# Patient Record
Sex: Female | Born: 1980 | Race: White | Hispanic: No | Marital: Single | Smoking: Current every day smoker
Health system: Southern US, Community
[De-identification: ages and names within clinical notes are randomized; demographics above are authoritative.]

## PROBLEM LIST (undated history)

## (undated) DIAGNOSIS — R87629 Unspecified abnormal cytological findings in specimens from vagina: Secondary | ICD-10-CM

## (undated) DIAGNOSIS — F319 Bipolar disorder, unspecified: Secondary | ICD-10-CM

## (undated) HISTORY — DX: Unspecified abnormal cytological findings in specimens from vagina: R87.629

---

## 2006-05-30 ENCOUNTER — Ambulatory Visit (HOSPITAL_COMMUNITY): Admission: RE | Admit: 2006-05-30 | Discharge: 2006-05-30 | Payer: Self-pay | Admitting: Family Medicine

## 2010-05-22 ENCOUNTER — Encounter: Admission: RE | Admit: 2010-05-22 | Discharge: 2010-05-22 | Payer: Self-pay | Admitting: Family Medicine

## 2011-11-25 HISTORY — PX: OTHER SURGICAL HISTORY: SHX169

## 2015-12-03 ENCOUNTER — Encounter (HOSPITAL_COMMUNITY): Payer: Self-pay | Admitting: *Deleted

## 2015-12-06 ENCOUNTER — Ambulatory Visit (HOSPITAL_COMMUNITY)
Admission: RE | Admit: 2015-12-06 | Discharge: 2015-12-06 | Disposition: A | Discharge status: Home / Self Care | Payer: Self-pay | Source: Ambulatory Visit | Attending: Obstetrics and Gynecology | Admitting: Obstetrics and Gynecology

## 2015-12-06 ENCOUNTER — Encounter (HOSPITAL_COMMUNITY): Payer: Self-pay

## 2015-12-06 VITALS — BP 122/76 | Temp 98.6°F | Ht 66.0 in | Wt 124.0 lb

## 2015-12-06 DIAGNOSIS — Z1239 Encounter for other screening for malignant neoplasm of breast: Secondary | ICD-10-CM

## 2015-12-06 DIAGNOSIS — R87612 Low grade squamous intraepithelial lesion on cytologic smear of cervix (LGSIL): Secondary | ICD-10-CM

## 2015-12-06 HISTORY — DX: Bipolar disorder, unspecified: F31.9

## 2015-12-06 NOTE — Addendum Note (Signed)
Encounter addended by: Priscille Heidelberghristine P Asiah Befort, RN on: 12/06/2015 11:04 AM<BR>     Documentation filed: Notes Section

## 2015-12-06 NOTE — Progress Notes (Addendum)
[facility] due to recommending a colposcopy to follow up for abnormal Pap smear on 11/22/2015.  Pap Smear:  Pap smear not completed today. [facility] with cells suspicious for HSIL. [facility] for a [facility]. Appointment scheduled for Thursday, December 27, 2015 at 1415. Per patient has a history of abnormal Pap smear 2 years ago that a colposcopy was completed for follow up. Pap smear result is scanned in EPIC under media.  Physical exam: Breasts Breasts symmetrical. No skin abnormalities bilateral breasts. No nipple retraction right breast. Left nipple inverted that per patient is normal for her. No nipple discharge bilateral breasts. No lymphadenopathy. No lumps palpated bilateral breasts. No complaints of pain or tenderness on exam. Screening mammogram recommended at age 35 unless clinically indicated prior.  Pelvic/Bimanual No Pap smear completed today since last Pap smear was 11/22/2015. Pap smear not indicated per BCCCP guidelines.       Smoking History: Smoking cessation discussed with patient. [facility] and gave resources to free smoking cessation classes offered at the [facility].  Patient Navigation: Patient education provided. [facility].

## 2015-12-06 NOTE — Patient Instructions (Addendum)
Educational materials on self breast awareness given. Explained to Brooke Young that colposcopy the needed follow up for her abnormal Pap smear on 11/22/2015. [facility] for a colposcopy. Appointment scheduled for Thursday, December 27, 2015 at 1415. Patient aware of appointment and will be there. Smoking cessation discussed with patient. Referred [facility] to the [facility] and gave resources to free smoking cessation classes offered at the [facility] Let patient know that a screening mammogram is recommended at age 35 unless clinically indicated prior. Brooke Young verbalized understanding.  [place]annock, Kathaleen Maserhristine Poll, RN 10:36 AM

## 2015-12-27 ENCOUNTER — Other Ambulatory Visit (HOSPITAL_COMMUNITY)
Admission: RE | Admit: 2015-12-27 | Discharge: 2015-12-27 | Disposition: A | Discharge status: Home / Self Care | Payer: Self-pay | Source: Ambulatory Visit | Attending: Obstetrics and Gynecology | Admitting: Obstetrics and Gynecology

## 2015-12-27 ENCOUNTER — Encounter: Payer: Self-pay | Admitting: Obstetrics and Gynecology

## 2015-12-27 ENCOUNTER — Ambulatory Visit (INDEPENDENT_AMBULATORY_CARE_PROVIDER_SITE_OTHER): Payer: Self-pay | Admitting: Obstetrics and Gynecology

## 2015-12-27 VITALS — BP 116/78 | HR 94 | Temp 98.2°F | Wt 125.3 lb

## 2015-12-27 DIAGNOSIS — N870 Mild cervical dysplasia: Secondary | ICD-10-CM | POA: Insufficient documentation

## 2015-12-27 DIAGNOSIS — R87612 Low grade squamous intraepithelial lesion on cytologic smear of cervix (LGSIL): Secondary | ICD-10-CM

## 2015-12-27 LAB — POCT PREGNANCY, URINE: PREG TEST UR: NEGATIVE

## 2015-12-27 NOTE — Progress Notes (Addendum)
GYNECOLOGY CLINIC COLPOSCOPY VISIT AND PROCEDURE NOTE  35 y.o. 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 Prior cervical cytology and/or colposcopy findings: yes. Thinks may have had colposcopy in 2014. Unsure those results. The patient reports the following prior treatments to the vulva/vagina/cervix: none. The patient is a cigarette smoker. The patient is not immunosuppressed. The patient is not pregnant. The patient is not taking anticoagulants and denies allergy to iodine.  Patient given informed consent, signed copy in the chart, time out was performed. Urine pregnancy test performed and confirmed to be negative.  Placed in lithotomy position.   Gross findings:   Vagina: normal  Vulva: normal  Cervix: normal  Visualization after:  Acetic acid: sharp acetowhite borders with mosaicism  Green or blue filter: no abnormal vessels  Upper one-third of vagina examined, revealing: normal mucosa  Biopsies obtained: yes, 3 (5, 10, and 12 o'clock)  ECC specimen obtained: yes  Colposcopy adequate? Yes  All specimens were labelled and sent to pathology.  Patient was given post procedure instructions.  Will follow up pathology and manage accordingly.    Patient unsure where previous pap and possible colpo were performed. I have asked her to attempt to obtain those records and either bring them to our office or ask that clinic to fax them to our office.    Shonna Chock, MD OB/GYN Fellow

## 2016-01-07 ENCOUNTER — Telehealth: Payer: Self-pay

## 2016-01-07 NOTE — Telephone Encounter (Signed)
Please review patient procedure on 12/27/2015 and let me know so I can call her with results.

## 2016-02-01 ENCOUNTER — Encounter: Payer: Self-pay | Admitting: Obstetrics & Gynecology

## 2016-02-01 ENCOUNTER — Ambulatory Visit (INDEPENDENT_AMBULATORY_CARE_PROVIDER_SITE_OTHER): Payer: Self-pay | Admitting: Obstetrics & Gynecology

## 2016-02-01 VITALS — BP 125/77 | HR 81 | Temp 98.1°F | Wt 127.1 lb

## 2016-02-01 DIAGNOSIS — N870 Mild cervical dysplasia: Secondary | ICD-10-CM

## 2016-02-01 NOTE — Progress Notes (Signed)
Patient ID: Brooke Young, female   DOB: Dec 18, 1980, 35 y.o.   MRN: [id number] [id number] Patient's last menstrual period was 01/31/2016 (exact date). Here for result of colposcopy and biopsies 01/10/16. She was told she needed LEEP but without high grade lesion I do not recommend LEEP and she should have repeat pap with cotesting in 12 months.   Adam PhenixJames G Arnold, MD 02/01/2016

## 2016-02-01 NOTE — Patient Instructions (Signed)
Cervical Dysplasia  Cervical dysplasia is a condition in which a woman has abnormal changes in the cells of her cervix. The cervix is the opening to the uterus (womb). It is located between the vagina and the uterus. Cervical dysplasia may be the first sign of cervical cancer.   With early detection, treatment, and close follow-up care, nearly all cases of cervical dysplasia can be cured. If left untreated, dysplasia may become more severe.   CAUSES   Cervical dysplasia can be caused by a human papillomavirus (HPV) infection.  RISK FACTORS   · Having had a sexually transmitted disease, such as chlamydia or a human papillomavirus (HPV) infection.    · Becoming sexually active before age 18.    · Having had more than one sexual partner.    · Not using protection during sexual intercourse, especially with new sexual partners.    · Having had cancer of the vagina or vulva.    · Having a sexual partner whose previous partner had cancer of the cervix or cervical dysplasia.    · Having a sexual partner who has or has had cancer of the penis.    · Having a weakened immune system (such as from having HIV or an organ transplant).    · Being the daughter of a woman who took diethylstilbestrol (DES) during pregnancy.    · Having a family history of cervical cancer.    · Smoking.  SIGNS AND SYMPTOMS   There are usually no symptoms. If there are symptoms, they may include:   · Abnormal vaginal discharge.    · Bleeding between periods or after intercourse.    · Bleeding during menopause.    · Pain during sexual intercourse (dyspareunia).  DIAGNOSIS   A test called a Pap test may be done. During this test, cells are taken from the cervix and then looked at under a microscope. A test in which tissue is removed from the cervix (biopsy) may also be done if the Pap test is abnormal or if the cervix looks abnormal.    TREATMENT   Treatment varies based on the severity of the cervical dysplasia. Treatment may include:  · Cryotherapy.  During cryotherapy, the abnormal cells are frozen with a steel-tip instrument.    · A procedure to remove abnormal tissue from the cervix.  · Surgery to remove abnormal tissue. This is usually done in serious cases of cervical dysplasia. Surgical options include:    A cone biopsy. This is a procedure in which the cervical canal and a portion of the center of the cervix are removed.      Hysterectomy. This is a surgery in which the uterus and cervix are removed.  HOME CARE INSTRUCTIONS   · Only take over-the-counter or prescription medicines for pain or discomfort as directed by your health care provider.    · Do not use tampons, have sexual intercourse, or douche until your health care provider says it is okay.  · Keep follow-up appointments as directed by your health care provider. Women who have been treated for cervical dysplasia should have regular pelvic exams and Pap tests. During the first year following treatment of cervical dysplasia, Pap tests should be done every 3-4 months. In the second year, they should be done every 6 months or as recommended by your health care provider.  · To prevent the condition from developing again, practice safe sex.  SEEK MEDICAL CARE IF:   You develop genital warts.    SEEK IMMEDIATE MEDICAL CARE IF:   · Your menstrual period is heavier than normal.    · You develop   bright red bleeding, especially if you have blood clots.    · You have a fever.    · You have increasing cramps or pain not relieved with medicine.    · You are light-headed, unusually weak, or have fainting spells.    · You have abnormal vaginal discharge.    · You have abdominal pain.      This information is not intended to replace advice given to you by your health care provider. Make sure you discuss any questions you have with your health care provider.     Document Released: 11/10/2005 Document Revised: 11/15/2013 Document Reviewed: 07/06/2013  [employer]vier Interactive Patient Education ©2016 [employer]

## 2016-05-12 ENCOUNTER — Emergency Department (HOSPITAL_COMMUNITY)
Admission: EM | Admit: 2016-05-12 | Discharge: 2016-05-12 | Disposition: A | Discharge status: Home / Self Care | Payer: Self-pay | Attending: Emergency Medicine | Admitting: Emergency Medicine

## 2016-05-12 ENCOUNTER — Encounter (HOSPITAL_COMMUNITY): Payer: Self-pay | Admitting: Emergency Medicine

## 2016-05-12 ENCOUNTER — Emergency Department (HOSPITAL_COMMUNITY): Payer: Self-pay

## 2016-05-12 DIAGNOSIS — F17210 Nicotine dependence, cigarettes, uncomplicated: Secondary | ICD-10-CM | POA: Insufficient documentation

## 2016-05-12 DIAGNOSIS — M5431 Sciatica, right side: Secondary | ICD-10-CM

## 2016-05-12 DIAGNOSIS — Z8739 Personal history of other diseases of the musculoskeletal system and connective tissue: Secondary | ICD-10-CM | POA: Insufficient documentation

## 2016-05-12 DIAGNOSIS — W19XXXA Unspecified fall, initial encounter: Secondary | ICD-10-CM | POA: Insufficient documentation

## 2016-05-12 DIAGNOSIS — R202 Paresthesia of skin: Secondary | ICD-10-CM | POA: Insufficient documentation

## 2016-05-12 DIAGNOSIS — M549 Dorsalgia, unspecified: Secondary | ICD-10-CM

## 2016-05-12 DIAGNOSIS — M5441 Lumbago with sciatica, right side: Secondary | ICD-10-CM | POA: Insufficient documentation

## 2016-05-12 DIAGNOSIS — Z79899 Other long term (current) drug therapy: Secondary | ICD-10-CM | POA: Insufficient documentation

## 2016-05-12 DIAGNOSIS — M4180 Other forms of scoliosis, site unspecified: Secondary | ICD-10-CM | POA: Insufficient documentation

## 2016-05-12 MED ORDER — NAPROXEN 500 MG PO TABS: 500.0000 mg | ORAL_TABLET | Freq: Two times a day (BID) | ORAL | Status: AC | PRN

## 2016-05-12 MED ORDER — PREDNISONE 20 MG PO TABS
60.0000 mg | ORAL_TABLET | Freq: Once | ORAL | Status: AC
  Administered 2016-05-12: 60 mg via ORAL
  Filled 2016-05-12: qty 3

## 2016-05-12 MED ORDER — HYDROCODONE-ACETAMINOPHEN 5-325 MG PO TABS
1.0000 | ORAL_TABLET | Freq: Once | ORAL | Status: AC
  Administered 2016-05-12: 1 via ORAL
  Filled 2016-05-12: qty 1

## 2016-05-12 MED ORDER — NAPROXEN 250 MG PO TABS
500.0000 mg | ORAL_TABLET | Freq: Once | ORAL | Status: AC
  Administered 2016-05-12: 500 mg via ORAL
  Filled 2016-05-12: qty 2

## 2016-05-12 MED ORDER — CYCLOBENZAPRINE HCL 10 MG PO TABS: 10.0000 mg | ORAL_TABLET | Freq: Three times a day (TID) | ORAL | Status: AC | PRN

## 2016-05-12 MED ORDER — HYDROCODONE-ACETAMINOPHEN 5-325 MG PO TABS: 1.0000 | ORAL_TABLET | Freq: Four times a day (QID) | ORAL | Status: AC | PRN

## 2016-05-12 MED ORDER — PREDNISONE 20 MG PO TABS: ORAL_TABLET | ORAL | Status: AC

## 2016-05-12 NOTE — ED Notes (Signed)
Pt not in room.

## 2016-05-12 NOTE — Discharge Instructions (Signed)
Back Pain:  Your xrays are reassuring, they do not show any fractures of the bones in your spine. Your mid-back (thoracic spine) did have a slight curvature noted on xray (called "scoliosis"), which is a variant of the curves of your spine, but should not affect anything or cause any issues. Your symptoms are likely from inflammation around the prior bulging disc, as well as muscle strain and likely a bruise/contusion of the bones in your spine related to the fall. Follow the instructions below regarding your symptoms, including instructions on medications to take. Follow up with [facility] in 1-2 weeks for ongoing medical care and reassessment of your back pain. Return to the ER for changes or worsening symptoms as outlined below.  Your back pain should be treated with medicines such as ibuprofen or aleve and this back pain should get better over the next 2 weeks.  However if you develop severe or worsening pain, low back pain with fever, numbness, weakness or inability to walk or urinate, you should return to the ER immediately.  Please follow up with your doctor this week for a recheck if still having symptoms.  Avoid heavy lifting over 10 pounds over the next two weeks.  Low back pain is discomfort in the lower back that may be due to injuries to muscles and ligaments around the spine.  Occasionally, it may be caused by a a problem to a part of the spine called a disc.  The pain may last several days or a week;  However, most patients get completely well in 4 weeks.  Self - care:  The application of heat can help soothe the pain.  Maintaining your daily activities, including walking, is encourged, as it will help you get better faster than just staying in bed. Perform gentle stretching as discussed. Drink plenty of fluids.  Medications are also useful to help with pain control.  A commonly prescribed medication includes norco.  Do not drive or operate heavy machinery while taking this  medication.  Non steroidal anti inflammatory medications including Ibuprofen and naproxen;  These medications help both pain and swelling and are very useful in treating back pain.  They should be taken with food, as they can cause stomach upset, and more seriously, stomach bleeding.  You can take zantac in addition while taking this medication to help prevent stomach discomfort/indigestion. (If the naprosyn prescription is too expensive, you can buy Aleve 247m over the counter medication and just take two tablets twice daily with food).  Prednisone (steroid): this will help with the numbness/tingling in your leg/foot, take every morning with breakfast beginning on 05/13/16 since you got today's dose in the ER.  Muscle relaxants:  These medications can help with muscle tightness that is a cause of lower back pain.  Most of these medications can cause drowsiness, and it is not safe to drive or use dangerous machinery while taking them. Do not combine with alcohol.  SEEK IMMEDIATE MEDICAL ATTENTION IF: New numbness, tingling, weakness, or problem with the use of your arms or legs.  Severe back pain not relieved with medications.  Difficulty with or loss of control of your bowel or bladder control.  Increasing pain in any areas of the body (such as chest or abdominal pain).  Shortness of breath, dizziness or fainting.  Nausea (feeling sick to your stomach), vomiting, fever, or sweats.  You will need to follow up with [facility] in 1-2 weeks for reassessment and to establish  medical care.   Back Pain, Adult Back pain is very common. The pain often gets better over time. The cause of back pain is usually not dangerous. Most people can learn to manage their back pain on their own.  HOME CARE  Watch your back pain for any changes. The following actions may help to lessen any pain you are feeling:  Stay active. Start with short walks on flat ground if you can. Try to walk farther each  day.  Exercise regularly as told by your doctor. Exercise helps your back heal faster. It also helps avoid future injury by keeping your muscles strong and flexible.  Do not sit, drive, or stand in one place for more than 30 minutes.  Do not stay in bed. Resting more than 1-2 days can slow down your recovery.  Be careful when you bend or lift an object. Use good form when lifting:  Bend at your knees.  Keep the object close to your body.  Do not twist.  Sleep on a firm mattress. Lie on your side, and bend your knees. If you lie on your back, put a pillow under your knees.  Take medicines only as told by your doctor.  Put ice on the injured area.  Put ice in a plastic bag.  Place a towel between your skin and the bag.  Leave the ice on for 20 minutes, 2-3 times a day for the first 2-3 days. After that, you can switch between ice and heat packs.  Avoid feeling anxious or stressed. Find good ways to deal with stress, such as exercise.  Maintain a healthy weight. Extra weight puts stress on your back. GET HELP IF:   You have pain that does not go away with rest or medicine.  You have worsening pain that goes down into your legs or buttocks.  You have pain that does not get better in one week.  You have pain at night.  You lose weight.  You have a fever or chills. GET HELP RIGHT AWAY IF:   You cannot control when you poop (bowel movement) or pee (urinate).  Your arms or legs feel weak.  Your arms or legs lose feeling (numbness).  You feel sick to your stomach (nauseous) or throw up (vomit).  You have belly (abdominal) pain.  You feel like you may pass out (faint).   This information is not intended to replace advice given to you by your health care provider. Make sure you discuss any questions you have with your health care provider.   Document Released: 04/28/2008 Document Revised: 12/01/2014 Document Reviewed: 03/14/2014 Elsevier Interactive Patient Education  2016 [employer].  Back Exercises If you have pain in your back, do these exercises 2-3 times each day or as told by your doctor. When the pain goes away, do the exercises once each day, but repeat the steps more times for each exercise (do more repetitions). If you do not have pain in your back, do these exercises once each day or as told by your doctor. EXERCISES Single Knee to Chest Do these steps 3-5 times in a row for each leg:  Lie on your back on a firm bed or the floor with your legs stretched out.  Bring one knee to your chest.  Hold your knee to your chest by grabbing your knee or thigh.  Pull on your knee until you feel a gentle stretch in your lower back.  Keep doing the stretch for 10-30 seconds.  Slowly let go of your leg and straighten it. Pelvic Tilt Do these steps 5-10 times in a row:  Lie on your back on a firm bed or the floor with your legs stretched out.  Bend your knees so they point up to the ceiling. Your feet should be flat on the floor.  Tighten your lower belly (abdomen) muscles to press your lower back against the floor. This will make your tailbone point up to the ceiling instead of pointing down to your feet or the floor.  Stay in this position for 5-10 seconds while you gently tighten your muscles and breathe evenly. Cat-Cow Do these steps until your lower back bends more easily:  Get on your hands and knees on a firm surface. Keep your hands under your shoulders, and keep your knees under your hips. You may put padding under your knees.  Let your head hang down, and make your tailbone point down to the floor so your lower back is round like the back of a cat.  Stay in this position for 5 seconds.  Slowly lift your head and make your tailbone point up to the ceiling so your back hangs low (sags) like the back of a cow.  Stay in this position for 5 seconds. Press-Ups Do these steps 5-10 times in a row:  Lie on your belly (face-down) on the  floor.  Place your hands near your head, about shoulder-width apart.  While you keep your back relaxed and keep your hips on the floor, slowly straighten your arms to raise the top half of your body and lift your shoulders. Do not use your back muscles. To make yourself more comfortable, you may change where you place your hands.  Stay in this position for 5 seconds.  Slowly return to lying flat on the floor. Bridges Do these steps 10 times in a row:  Lie on your back on a firm surface.  Bend your knees so they point up to the ceiling. Your feet should be flat on the floor.  Tighten your butt muscles and lift your butt off of the floor until your waist is almost as high as your knees. If you do not feel the muscles working in your butt and the back of your thighs, slide your feet 1-2 inches farther away from your butt.  Stay in this position for 3-5 seconds.  Slowly lower your butt to the floor, and let your butt muscles relax. If this exercise is too easy, try doing it with your arms crossed over your chest. Belly Crunches Do these steps 5-10 times in a row:  Lie on your back on a firm bed or the floor with your legs stretched out.  Bend your knees so they point up to the ceiling. Your feet should be flat on the floor.  Cross your arms over your chest.  Tip your chin a little bit toward your chest but do not bend your neck.  Tighten your belly muscles and slowly raise your chest just enough to lift your shoulder blades a tiny bit off of the floor.  Slowly lower your chest and your head to the floor. Back Lifts Do these steps 5-10 times in a row: 1. Lie on your belly (face-down) with your arms at your sides, and rest your forehead on the floor. 2. Tighten the muscles in your legs and your butt. 3. Slowly lift your chest off of the floor while you keep your hips on the floor. Keep the  back of your head in line with the curve in your back. Look at the floor while you do  this. 4. Stay in this position for 3-5 seconds. 5. Slowly lower your chest and your face to the floor. GET HELP IF:  Your back pain gets a lot worse when you do an exercise.  Your back pain does not lessen 2 hours after you exercise. If you have any of these problems, stop doing the exercises. Do not do them again unless your doctor says it is okay. GET HELP RIGHT AWAY IF:  You have sudden, very bad back pain. If this happens, stop doing the exercises. Do not do them again unless your doctor says it is okay.   This information is not intended to replace advice given to you by your health care provider. Make sure you discuss any questions you have with your health care provider.   Document Released: 12/13/2010 Document Revised: 08/01/2015 Document Reviewed: 01/04/2015 Elsevier Interactive Patient Education 2016 [place] Injury Prevention Back injuries can be very painful. They can also be difficult to heal. After having one back injury, you are more likely to injure your back again. It is important to learn how to avoid injuring or re-injuring your back. The following tips can help you to prevent a back injury. WHAT SHOULD I KNOW ABOUT PHYSICAL FITNESS?  Exercise for 30 minutes per day on most days of the week or as told by your doctor. Make sure to:  Do aerobic exercises, such as walking, jogging, biking, or swimming.  Do exercises that increase balance and strength, such as tai chi and yoga.  Do stretching exercises. This helps with flexibility.  Try to develop strong belly (abdominal) muscles. Your belly muscles help to support your back.  Stay at a healthy weight. This helps to decrease your risk of a back injury. WHAT SHOULD I KNOW ABOUT MY DIET?  Talk with your doctor about your overall diet. Take supplements and vitamins only as told by your doctor.  Talk with your doctor about how much calcium and vitamin D you need each day. These nutrients help to prevent  weakening of the bones (osteoporosis).  Include good sources of calcium in your diet, such as:  Dairy products.  Green leafy vegetables.  Products that have had calcium added to them (fortified).  Include good sources of vitamin D in your diet, such as:  Milk.  Foods that have had vitamin D added to them. WHAT SHOULD I KNOW ABOUT MY POSTURE?  Sit up straight and stand up straight. Avoid leaning forward when you sit or hunching over when you stand.  Choose chairs that have good low-back (lumbar) support.  If you work at a desk, sit close to it so you do not need to lean over. Keep your chin tucked in. Keep your neck drawn back. Keep your elbows bent so your arms look like the letter "L" (right angle).  Sit high and close to the steering wheel when you drive. Add a low-back support to your car seat, if needed.  Avoid sitting or standing in one position for very long. Take breaks to get up, stretch, and walk around at least one time every hour. Take breaks every hour if you are driving for long periods of time.  Sleep on your side with your knees slightly bent, or sleep on your back with a pillow under your knees. Do not lie on the front of your body to sleep. WHAT SHOULD  I KNOW ABOUT LIFTING, TWISTING, AND REACHING Lifting and Heavy Lifting  Avoid heavy lifting, especially lifting over and over again. If you must do heavy lifting:  Stretch before lifting.  Work slowly.  Rest between lifts.  Use a tool such as a cart or a dolly to move objects if one is available.  Make several small trips instead of carrying one heavy load.  Ask for help when you need it, especially when moving big objects.  Follow these steps when lifting:  Stand with your feet shoulder-width apart.  Get as close to the object as you can. Do not pick up a heavy object that is far from your body.  Use handles or lifting straps if they are available.  Bend at your knees. Squat down, but keep your  heels off the floor.  Keep your shoulders back. Keep your chin tucked in. Keep your back straight.  Lift the object slowly while you tighten the muscles in your legs, belly, and butt. Keep the object as close to the center of your body as possible.  Follow these steps when putting down a heavy load:  Stand with your feet shoulder-width apart.  Lower the object slowly while you tighten the muscles in your legs, belly, and butt. Keep the object as close to the center of your body as possible.  Keep your shoulders back. Keep your chin tucked in. Keep your back straight.  Bend at your knees. Squat down, but keep your heels off the floor.  Use handles or lifting straps if they are available. Twisting and Reaching  Avoid lifting heavy objects above your waist.  Do not twist at your waist while you are lifting or carrying a load. If you need to turn, move your feet.  Do not bend over without bending at your knees.  Avoid reaching over your head, across a table, or for an object on a high surface.  WHAT ARE SOME OTHER TIPS?  Avoid wet floors and icy ground. Keep sidewalks clear of ice to prevent falls.   Do not sleep on a mattress that is too soft or too hard.   Keep items that you use often within easy reach.   Put heavier objects on shelves at waist level, and put lighter objects on lower or higher shelves.  Find ways to lower your stress, such as:  Exercise.  Massage.  Relaxation techniques.  Talk with your doctor if you feel anxious or depressed. These conditions can make back pain worse.  Wear flat heel shoes with cushioned soles.  Avoid making quick (sudden) movements.  Use both shoulder straps when carrying a backpack.  Do not use any tobacco products, including cigarettes, chewing tobacco, or electronic cigarettes. If you need help quitting, ask your doctor.   This information is not intended to replace advice given to you by your health care provider. Make  sure you discuss any questions you have with your health care provider.   Document Released: 04/28/2008 Document Revised: 03/27/2015 Document Reviewed: 11/14/2014 Elsevier Interactive Patient Education 2016 [employer]   Radicular Pain Radicular pain in either the arm or leg is usually from a bulging or herniated disk in the spine. A piece of the herniated disk may press against the nerves as the nerves exit the spine. This causes pain which is felt at the tips of the nerves down the arm or leg. Other causes of radicular pain may include:  Fractures.  Heart disease.  Cancer.  An abnormal and usually  degenerative state of the nervous system or nerves (neuropathy). Diagnosis may require CT or MRI scanning to determine the primary cause.  Nerves that start at the neck (nerve roots) may cause radicular pain in the outer shoulder and arm. It can spread down to the thumb and fingers. The symptoms vary depending on which nerve root has been affected. In most cases radicular pain improves with conservative treatment. Neck problems may require physical therapy, a neck collar, or cervical traction. Treatment may take many weeks, and surgery may be considered if the symptoms do not improve.  Conservative treatment is also recommended for sciatica. Sciatica causes pain to radiate from the lower back or buttock area down the leg into the foot. Often there is a history of back problems. Most patients with sciatica are better after 2 to 4 weeks of rest and other supportive care. Short term bed rest can reduce the disk pressure considerably. Sitting, however, is not a good position since this increases the pressure on the disk. You should avoid bending, lifting, and all other activities which make the problem worse. Traction can be used in severe cases. Surgery is usually reserved for patients who do not improve within the first months of treatment. Only take over-the-counter or prescription medicines for  pain, discomfort, or fever as directed by your caregiver. Narcotics and muscle relaxants may help by relieving more severe pain and spasm and by providing mild sedation. Cold or massage can give significant relief. Spinal manipulation is not recommended. It can increase the degree of disc protrusion. Epidural steroid injections are often effective treatment for radicular pain. These injections deliver medicine to the spinal nerve in the space between the protective covering of the spinal cord and back bones (vertebrae). Your caregiver can give you more information about steroid injections. These injections are most effective when given within two weeks of the onset of pain.  You should see your caregiver for follow up care as recommended. A program for neck and back injury rehabilitation with stretching and strengthening exercises is an important part of management.  SEEK IMMEDIATE MEDICAL CARE IF:  You develop increased pain, weakness, or numbness in your arm or leg.  You develop difficulty with bladder or bowel control.  You develop abdominal pain.   This information is not intended to replace advice given to you by your health care provider. Make sure you discuss any questions you have with your health care provider.   Document Released: 12/18/2004 Document Revised: 12/01/2014 Document Reviewed: 06/06/2015 Elsevier Interactive Patient Education 2016 [employer] Inc.  Sciatica Sciatica is pain, weakness, numbness, or tingling along your sciatic nerve. The nerve starts in the lower back and runs down the back of each leg. Nerve damage or certain conditions pinch or put pressure on the sciatic nerve. This causes the pain, weakness, and other discomforts of sciatica. HOME CARE   Only take medicine as told by your doctor.  Apply ice to the affected area for 20 minutes. Do this 3-4 times a day for the first 48-72 hours. Then try heat in the same way.  Exercise, stretch, or do your usual activities  if these do not make your pain worse.  Go to physical therapy as told by your doctor.  Keep all doctor visits as told.  Do not wear high heels or shoes that are not supportive.  Get a firm mattress if your mattress is too soft to lessen pain and discomfort. GET HELP RIGHT AWAY IF:   You cannot control when  you poop (bowel movement) or pee (urinate).  You have more weakness in your lower back, lower belly (pelvis), butt (buttocks), or legs.  You have redness or puffiness (swelling) of your back.  You have a burning feeling when you pee.  You have pain that gets worse when you lie down.  You have pain that wakes you from your sleep.  Your pain is worse than past pain.  Your pain lasts longer than 4 weeks.  You are suddenly losing weight without reason. MAKE SURE YOU:   Understand these instructions.  Will watch this condition.  Will get help right away if you are not doing well or get worse.   This information is not intended to replace advice given to you by your health care provider. Make sure you discuss any questions you have with your health care provider.   Document Released: 08/19/2008 Document Revised: 08/01/2015 Document Reviewed: 03/21/2012 Elsevier Interactive Patient Education 2016 [employer] Inc[employer]  Scoliosis Scoliosis is the name given to a spine that curves sideways.Scoliosis can cause twisting of your shoulders, hips, chest, back, and rib cage.  CAUSES  The cause of scoliosis is not always known. It may be caused by a birth defect or by a disease that can cause muscular dysfunction and imbalance, such as cerebral palsy and muscular dystrophy.  RISK FACTORS Having a disease that causes muscle disease or dysfunction. SIGNS AND SYMPTOMS Scoliosis often has no signs or symptoms.If they are present, they may include:  Unequal size of one body side compared to the other (asymmetry).  Visible curvature of the spine.  Pain. The pain may limit physical  activity.  Shortness of breath.  Bowel or bladder issues. DIAGNOSIS A skilled health care provider will perform an evaluation. This will involve:  Taking your history.  Performing a physical examination.  Performing a neurological exam to detect nerve or muscle function loss.  Range of motion studies on the spine.  X-rays. An MRI may also be obtained. TREATMENT  Treatment varies depending on the nature, extent, and severity of the disease. If the curvature is not great, you may need only observation. A brace may be used to prevent scoliosis from progressing. A brace may also be needed during growth spurts. Physical therapy may be of benefit. Surgery may be required.  HOME CARE INSTRUCTIONS   Your health care provider may suggest exercises to strengthen your muscles. Perform them as directed.  Ask your health care provider before participating in any sports.   If you have been prescribed an orthopedic brace, wear it as instructed by your health care provider. SEEK MEDICAL CARE IF: Your brace causes the skin to become sore (chafe) or is uncomfortable.  SEEK IMMEDIATE MEDICAL CARE IF:  You have back pain that is not relieved by the medicines prescribed by your health care provider.   Your legs feel weak or you lose function in your legs.  You lose some bowel or bladder control.    This information is not intended to replace advice given to you by your health care provider. Make sure you discuss any questions you have with your health care provider.   Document Released: 11/07/2000 Document Revised: 11/15/2013 Document Reviewed: 07/18/2013 Elsevier Interactive Patient Education [employer].

## 2016-05-12 NOTE — ED Provider Notes (Signed)
CSN: [id number]     Arrival date & time 05/12/16  4098 History   First MD Initiated Contact with Patient 05/12/16 [phone]     Chief Complaint  Patient presents with  . Fall  . Back Pain  . Numbness     (Consider location/radiation/quality/duration/timing/severity/associated sxs/prior Treatment) HPI Comments: Brooke Young is a 35 y.o. female with a PMHx of L5/S1 annular bulging disc and bipolar 1 disorder, who presents to the ED with complaints of mid and low back pain 4 days after a fall while playing roller [place]. Patient states that her legs were kicked out from under her during practice on Thursday, causing her to fall backwards onto her back. Denies head injury or LOC. Since then she has had gradually worsening mid and lower back pain which she describes as 5/10 intermittent sharp pain in the mid and lower back, radiating down the posterior right leg, worse with sitting, standing, and movements or activities, and improved somewhat with Tylenol, heat, and ice. Associated symptoms include intermittent numbness and tingling in the right foot she describes as a "fallen asleep feeling". She had these similar symptoms when she was told she had a bulging disc 40yrs ago.   She denies any fevers, chills, chest pain, shortness breath, abdominal pain, nausea vomiting, diarrhea, constipation, dysuria, hematuria, incontinence of urine or stool, cauda equina symptoms or saddle anesthesia, bruising, or focal weakness. No PCP due to not having insurance at this time.   Patient is a 35 y.o. female presenting with fall and back pain. The history is provided by the patient and medical records. No language interpreter was used.  Fall This is a new problem. The current episode started in the past 7 days. The problem occurs rarely. The problem has been unchanged. Pertinent negatives include no abdominal pain, arthralgias, chest pain, chills, fever, myalgias, nausea, neck pain, numbness, urinary symptoms, vomiting or  weakness. The symptoms are aggravated by standing and walking. She has tried acetaminophen, heat and ice for the symptoms. The treatment provided moderate relief.  Back Pain Associated symptoms: no abdominal pain, no chest pain, no dysuria, no fever, no numbness and no weakness     Past Medical History  Diagnosis Date  . Bipolar 1 disorder (HCC)   . Vaginal Pap smear, abnormal    Past Surgical History  Procedure Laterality Date  . Laparoscopic surgery  2013   Family History  Problem Relation Age of Onset  . Hypertension Mother   . Stroke Maternal Grandmother    Social History  Substance Use Topics  . Smoking status: Current Every Day Smoker -- 1.00 packs/day    Types: Cigarettes  . Smokeless tobacco: Never Used  . Alcohol Use: Yes     Comment: occassionally   OB History    Gravida Para Term Preterm AB TAB SAB Ectopic Multiple Living   1    1  1         Review of Systems  Constitutional: Negative for fever and chills.  HENT: Negative for facial swelling (no head inj).   Respiratory: Negative for shortness of breath.   Cardiovascular: Negative for chest pain.  Gastrointestinal: Negative for nausea, vomiting, abdominal pain, diarrhea and constipation.  Genitourinary: Negative for dysuria, hematuria and difficulty urinating (no incontinence).  Musculoskeletal: Positive for back pain. Negative for myalgias, arthralgias and neck pain.  Skin: Negative for color change.  Allergic/Immunologic: Negative for immunocompromised state.  Neurological: Negative for syncope, weakness and numbness.       +tingling/numbness R  foot  Psychiatric/Behavioral: Negative for confusion.   10 Systems reviewed and are negative for acute change except as noted in the HPI.    Allergies  Review of patient's allergies indicates no known allergies.  Home Medications   Prior to Admission medications   Medication Sig Start Date End Date Taking? Authorizing Provider  B Complex-Folic Acid (B  COMPLEX-VITAMIN B12 PO) Take 1 capsule by mouth daily.    Historical Provider, MD  bismuth subsalicylate (PEPTO BISMOL) 262 MG/15ML suspension Take 30 mLs by mouth every 6 (six) hours as needed.    Historical Provider, MD  diphenhydrAMINE (BENADRYL) 25 mg capsule Take 25 mg by mouth every 6 (six) hours as needed.    Historical Provider, MD  ibuprofen (ADVIL,MOTRIN) 600 MG tablet Take 600 mg by mouth every 6 (six) hours as needed.    Historical Provider, MD  Multiple Vitamin (MULTIVITAMIN) tablet Take 1 tablet by mouth daily.    Historical Provider, MD  norgestimate-ethinyl estradiol (SPRINTEC 28) 0.25-35 MG-MCG tablet Take 1 tablet by mouth daily.    Historical Provider, MD   BP 140/92 mmHg  Pulse 84  Temp(Src) 98 F (36.7 C) (Oral)  Resp 18  Ht 5\' 6"  (1.676 m)  Wt 54.432 kg  BMI 19.38 kg/m2  SpO2 100%  LMP 04/21/2016 Physical Exam  Constitutional: She is oriented to person, place, and time. Vital signs are normal. She appears well-developed and well-nourished.  Non-toxic appearance. No distress.  Afebrile, nontoxic, NAD  HENT:  Head: Normocephalic and atraumatic.  Mouth/Throat: Oropharynx is clear and moist and mucous membranes are normal.  Eyes: Conjunctivae and EOM are normal. Right eye exhibits no discharge. Left eye exhibits no discharge.  Neck: Normal range of motion. Neck supple. No spinous process tenderness and no muscular tenderness present. No rigidity. Normal range of motion present.  FROM intact without spinous process TTP, no bony stepoffs or deformities, no paraspinous muscle TTP or muscle spasms. No rigidity or meningeal signs. No bruising or swelling.   Cardiovascular: Normal rate, regular rhythm, normal heart sounds and intact distal pulses.  Exam reveals no gallop and no friction rub.   No murmur heard. Pulmonary/Chest: Effort normal and breath sounds normal. No respiratory distress. She has no decreased breath sounds. She has no wheezes. She has no rhonchi. She has no  rales.  Abdominal: Soft. Normal appearance and bowel sounds are normal. She exhibits no distension. There is no tenderness. There is no rigidity, no rebound, no guarding, no CVA tenderness, no tenderness at McBurney's point and negative Murphy's sign.  Musculoskeletal: Normal range of motion.       Thoracic back: She exhibits tenderness and bony tenderness. She exhibits normal range of motion, no swelling, no deformity and no spasm.       Lumbar back: She exhibits tenderness and bony tenderness. She exhibits normal range of motion, no swelling, no deformity and no spasm.       Back:  Thoracic and Lumbar spine with FROM intact with mild midline spinous process TTP near T10 and L3 regions, no bony stepoffs or deformities, no paraspinous muscle TTP or muscle spasms. Strength 5/5 in all extremities, sensation grossly intact in all extremities although she reports slightly diminished sensation to the R lateral lower leg and lateral foot, +R sided SLR, neg SLR on L side, gait steady. No overlying skin changes, no bruising or swelling noted to the spine. Distal pulses intact.   Neurological: She is alert and oriented to person, place, and time. She has  normal strength. No sensory deficit.  Skin: Skin is warm, dry and intact. No rash noted.  Psychiatric: She has a normal mood and affect.  Nursing note and vitals reviewed.   ED Course  Procedures (including critical care time) Labs Review Labs Reviewed - No data to display  Imaging Review Dg Thoracic Spine W/swimmers  05/12/2016  CLINICAL DATA:  Fall roller-skating. Back pain. Numbness in low foot. EXAM: THORACIC SPINE - 3 VIEWS COMPARISON:  None. FINDINGS: Slight levoscoliosis in the upper thoracic spine. No fracture or subluxation. IMPRESSION: No acute bony abnormality. Electronically Signed   By: Charlett Nose M.D.   On: 05/12/2016 09:37   Dg Lumbar Spine Complete  05/12/2016  CLINICAL DATA:  Fall roller-skating. Back pain. Numbness and tingling in  foot. EXAM: LUMBAR SPINE - COMPLETE 4+ VIEW COMPARISON:  MRI 05/22/2010 FINDINGS: Transitional anatomy at the lumbosacral junction. Normal alignment. No fracture or subluxation. SI joints are symmetric and unremarkable. IMPRESSION: No acute bony abnormality. Electronically Signed   By: Charlett Nose M.D.   On: 05/12/2016 09:37     05/22/10 MRI L-spine without contrast Study Result     Clinical Data: Low back pain. Lifting injury. Right leg pain.  MRI LUMBAR SPINE WITHOUT CONTRAST  Technique: Multiplanar and multiecho pulse sequences of the lumbar spine were obtained without intravenous contrast.  Comparison: None  Findings: Anatomy is transitional. I am assuming that S1 is a transitional vertebra. Correlation with this numbering scheme should be performed if any intervention is contemplated.  There is no abnormality at L4-5 or above. The discs are normal in signal characteristics and morphology. The spinal canal and foramina are widely patent. The distal cord and conus are normal.  At L5-S1, the disc shows degeneration and annular tearing. There is annular bulging. There is no neural compression.  S1-2 is transitional and unremarkable.  IMPRESSION: S1 is transitional.  Single level pathology at L5-S1 with desiccation and annular bulging. No apparent neural compression however. The findings could certainly relate to back pain.  Provider: Isaiah Blakes    I have personally reviewed and evaluated these images and lab results as part of my medical decision-making.   EKG Interpretation None      MDM   Final diagnoses:  Back pain  Fall, initial encounter  Right leg paresthesias  Sciatica, right side  History of herniated intervertebral disc  Levoscoliosis    35 y.o. female here with back pain s/p fall during roller derby 4 days ago, fell straight down onto her back. Some paresthesias to her R foot intermittently since then. Mildly TTP along  midline thoracic and lumbar areas, no bruising noted, but tenderness is directly overtop of the spinous processes, will obtain xray to ensure no acute fx. +SLR of R side. NVI in all extremities. MRI previously in 2011 showed L5/S1 bulging disc which could explain her sciatica type symptoms today, will start on short course of prednisone. No red flag s/s of low back pain. No s/s of central cord compression or cauda equina. Lower extremities are neurovascularly intact and patient is ambulating without difficulty. Doubt need for an emergent MRI today. Will obtain xray and reassess after this, will give norco/naprosyn/prednisone now and reassess shortly  10:25 AM Xrays neg for acute findings, showed mild scoliosis of T-spine. Pt feeling better after meds. Patient was counseled on back pain precautions and told to do activity as tolerated but do not lift, push, or pull heavy objects more than 10 pounds for the next week. Patient  counseled to use ice or heat on back for no longer than 15 minutes every hour.   Rx given for muscle relaxer and counseled on proper use of muscle relaxant medication. Rx given for narcotic pain medicine and counseled on proper use of narcotic pain medications. Told that they can increase to every 4 hrs if needed while pain is worse. Counseled not to combine this medication with others containing tylenol. Urged patient not to drink alcohol, drive, or perform any other activities that requires focus while taking either of these medications.   Patient urged to follow-up with [facility] in 1-2wks to establish care, and for f/up if pain does not improve with treatment and rest or if pain becomes recurrent. Urged to return with worsening severe pain, loss of bowel or bladder control, trouble walking. The patient verbalizes understanding and agrees with the plan.   BP 140/92 mmHg  Pulse 84  Temp(Src) 98 F (36.7 C) (Oral)  Resp 18  Ht 5\' 6"  (1.676 m)  Wt 54.432 kg  BMI 19.38 kg/m2  SpO2 100%   LMP 04/21/2016  Meds ordered this encounter  Medications  . predniSONE (DELTASONE) tablet 60 mg    Sig:   . HYDROcodone-acetaminophen (NORCO/VICODIN) 5-325 MG per tablet 1 tablet    Sig:   . naproxen (NAPROSYN) tablet 500 mg    Sig:   . naproxen (NAPROSYN) 500 MG tablet    Sig: Take 1 tablet (500 mg total) by mouth 2 (two) times daily as needed for mild pain or moderate pain (TAKE WITH MEALS.).    Dispense:  20 tablet    Refill:  0    Order Specific Question:  Supervising Provider    Answer:  MILLER, BRIAN [[id number]]  . HYDROcodone-acetaminophen (NORCO) 5-325 MG tablet    Sig: Take 1 tablet by mouth every 6 (six) hours as needed for severe pain.    Dispense:  10 tablet    Refill:  0    Order Specific Question:  Supervising Provider    Answer:  Hyacinth Meeker, BRIAN [[id number]]  . cyclobenzaprine (FLEXERIL) 10 MG tablet    Sig: Take 1 tablet (10 mg total) by mouth 3 (three) times daily as needed for muscle spasms.    Dispense:  15 tablet    Refill:  0    Order Specific Question:  Supervising Provider    Answer:  MILLER, BRIAN [[id number]]  . predniSONE (DELTASONE) 20 MG tablet    Sig: 3 tabs po daily x 3 days starting on 05/13/16    Dispense:  9 tablet    Refill:  0    Order Specific Question:  Supervising Provider    Answer:  Eber Hong [[id number]]     Brooke Folds Camprubi-Soms, PA-C 05/12/16 1026  Jacalyn Lefevre, MD 05/12/16 [phone]

## 2016-05-12 NOTE — ED Notes (Signed)
Pt. Stated, I was roller derby and I fell right on my back and like bumped twice. I felt like I needed to come since I have a numbness in my foot and tingling, I've had a bulging disc 6 years ago.

## 2016-06-11 ENCOUNTER — Encounter (HOSPITAL_COMMUNITY): Payer: Self-pay | Admitting: *Deleted

## 2016-07-04 ENCOUNTER — Telehealth (HOSPITAL_BASED_OUTPATIENT_CLINIC_OR_DEPARTMENT_OTHER): Payer: Self-pay | Admitting: Emergency Medicine

## 2017-05-21 IMAGING — CR DG LUMBAR SPINE COMPLETE 4+V
5 series · 5 of 5 positions shown · non-contrast
Comparison: MRI 05/22/2010

CLINICAL DATA: Fall roller-skating. Back pain. Numbness and
tingling in foot.

EXAM:
LUMBAR SPINE - COMPLETE 4+ VIEW

[l-spine ap]
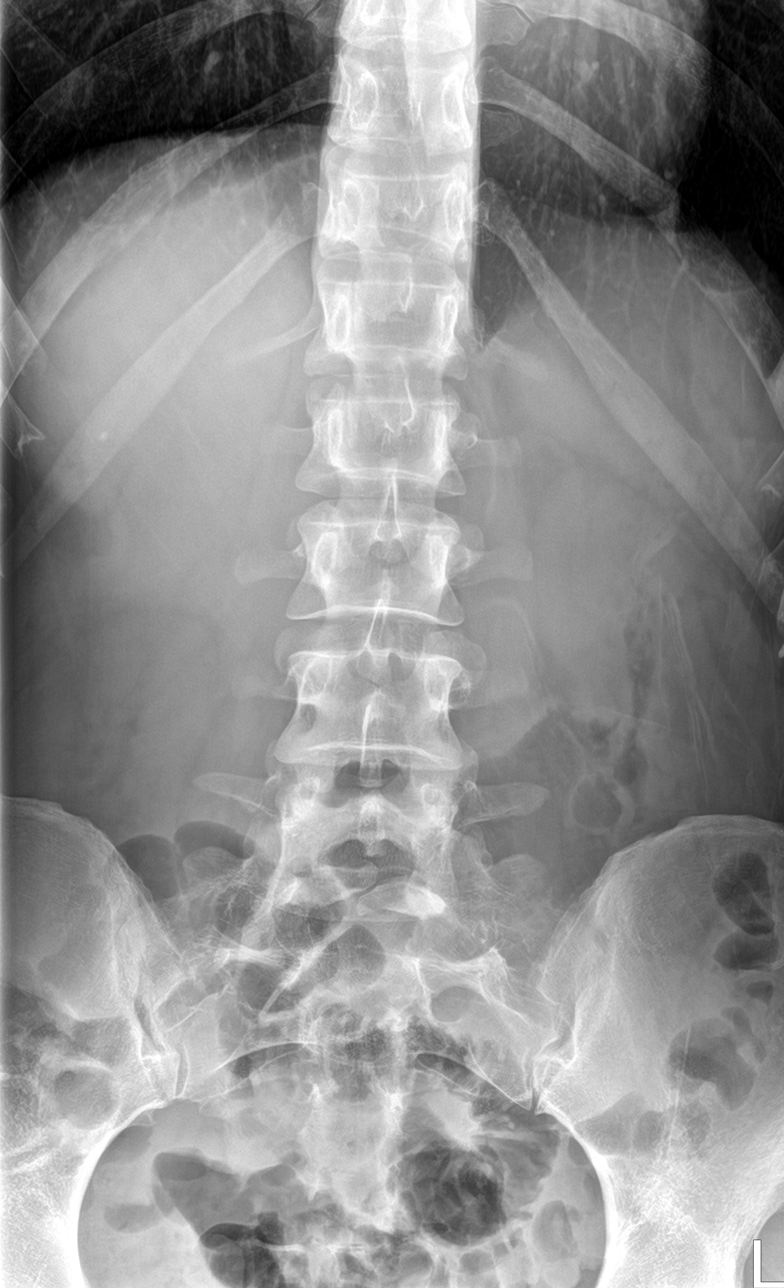

[l-spine obl (1 of 2)]
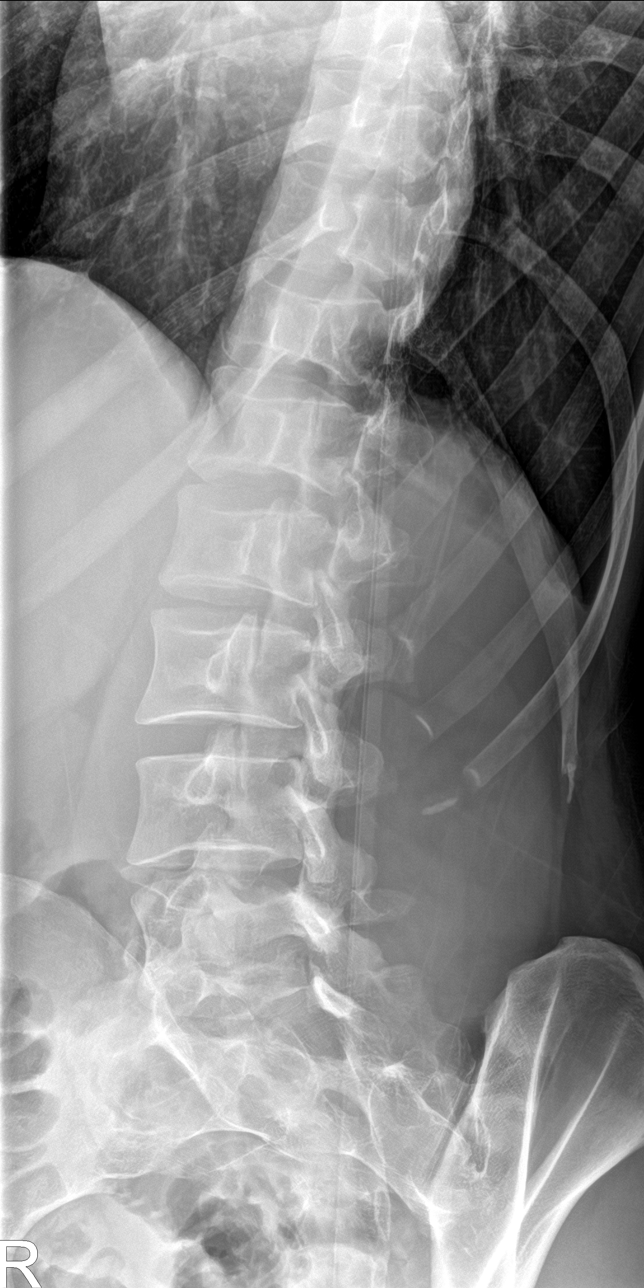

[l-spine obl (2 of 2)]
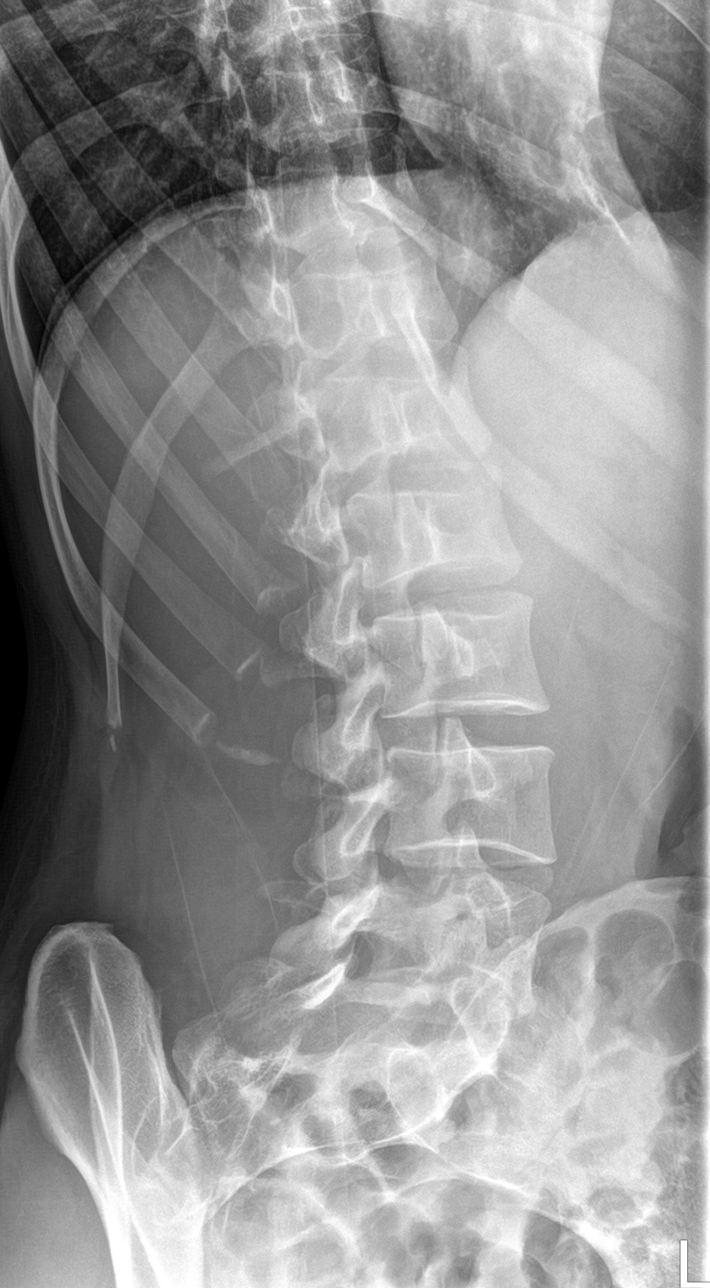

[l-spine lat]
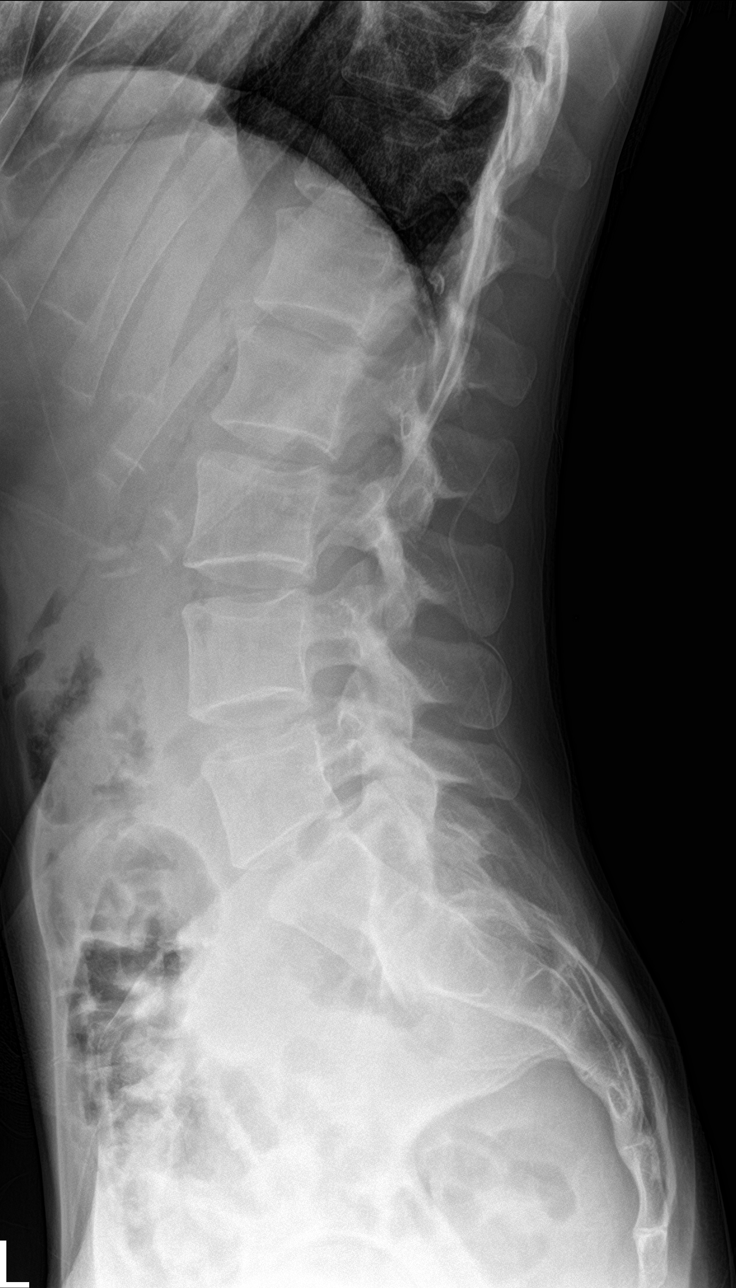

[l-spine spot]
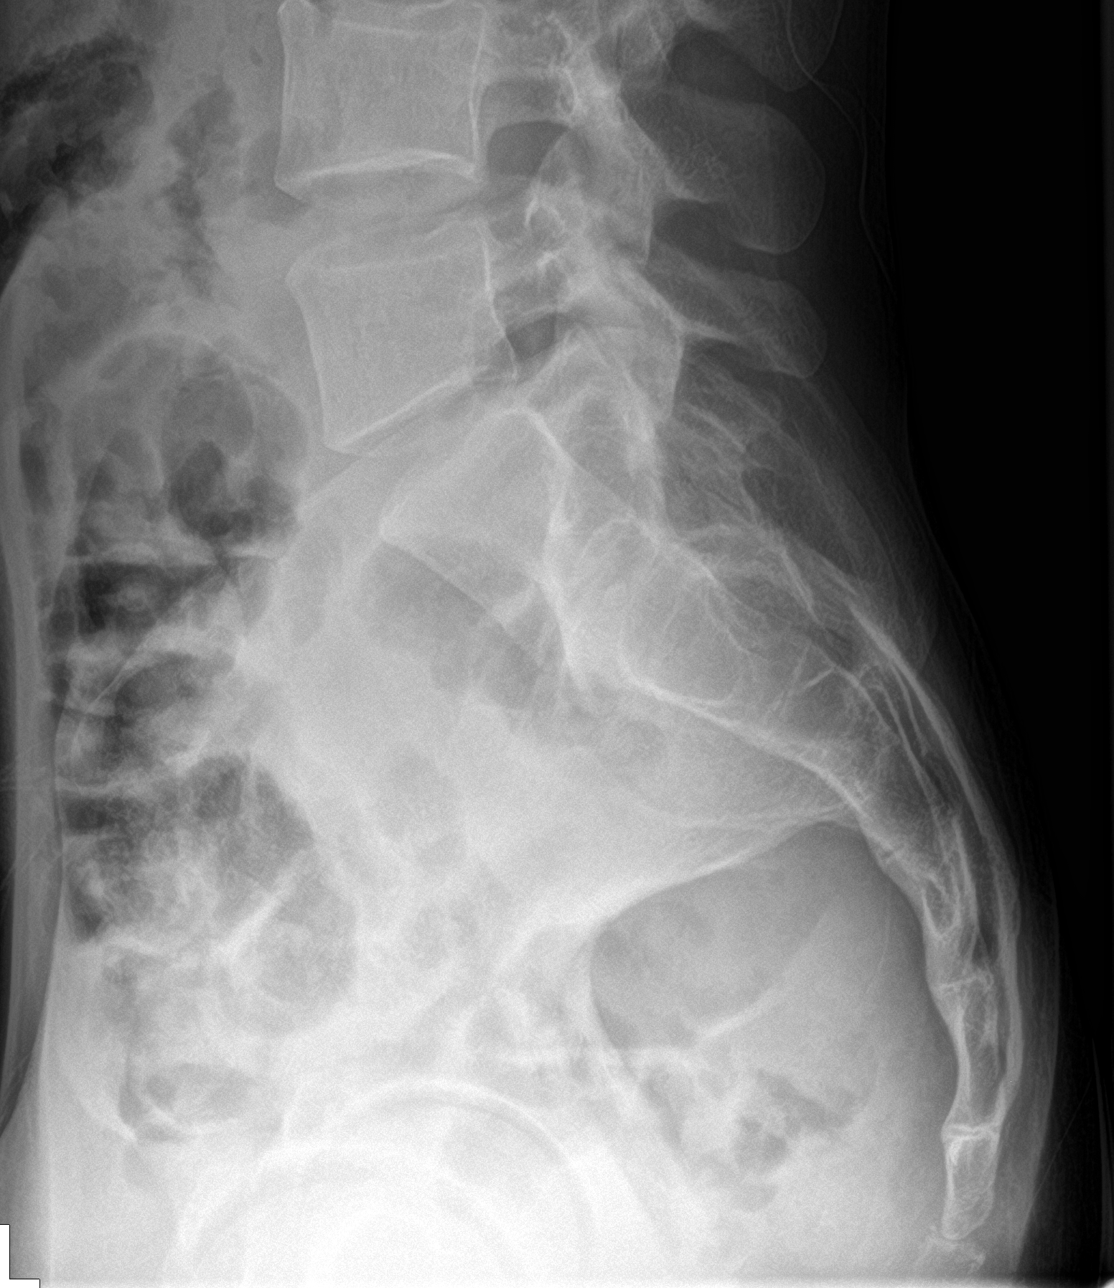

[5 of 5 positions shown; findings below may reference images not displayed]

FINDINGS: Transitional anatomy at the lumbosacral junction. Normal alignment.
No fracture or subluxation. SI joints are symmetric and
unremarkable.
IMPRESSION: No acute bony abnormality.

## 2021-10-25 DIAGNOSIS — Z0389 Encounter for observation for other suspected diseases and conditions ruled out: Secondary | ICD-10-CM | POA: Diagnosis not present

## 2021-10-25 DIAGNOSIS — Z1388 Encounter for screening for disorder due to exposure to contaminants: Secondary | ICD-10-CM | POA: Diagnosis not present

## 2021-10-25 DIAGNOSIS — Z3009 Encounter for other general counseling and advice on contraception: Secondary | ICD-10-CM | POA: Diagnosis not present

## 2024-10-27 DIAGNOSIS — Z3009 Encounter for other general counseling and advice on contraception: Secondary | ICD-10-CM | POA: Diagnosis not present

## 2024-10-27 DIAGNOSIS — Z1239 Encounter for other screening for malignant neoplasm of breast: Secondary | ICD-10-CM | POA: Diagnosis not present

## 2024-10-27 DIAGNOSIS — N760 Acute vaginitis: Secondary | ICD-10-CM | POA: Diagnosis not present

## 2024-10-27 DIAGNOSIS — Z01419 Encounter for gynecological examination (general) (routine) without abnormal findings: Secondary | ICD-10-CM | POA: Diagnosis not present

## 2024-10-27 DIAGNOSIS — Z113 Encounter for screening for infections with a predominantly sexual mode of transmission: Secondary | ICD-10-CM | POA: Diagnosis not present

## 2024-10-27 DIAGNOSIS — Z114 Encounter for screening for human immunodeficiency virus [HIV]: Secondary | ICD-10-CM | POA: Diagnosis not present

## 2024-11-01 ENCOUNTER — Telehealth: Payer: Self-pay

## 2024-11-01 NOTE — Telephone Encounter (Addendum)
 Telephoned patient at mobile number. Left a voice message with [facility] (scholarship) contact information.  Telephoned patient at mobile number. Patient will call back at a later time. 12/08/2024
